# Patient Record
Sex: Male | Born: 2000 | Race: White | Hispanic: No | Marital: Single | Smoking: Never smoker
Health system: Southern US, Community
[De-identification: ages and names within clinical notes are randomized; demographics above are authoritative.]

---

## 2020-10-15 ENCOUNTER — Ambulatory Visit (HOSPITAL_COMMUNITY)
Admission: EM | Admit: 2020-10-15 | Discharge: 2020-10-15 | Disposition: A | Discharge status: Home / Self Care | Payer: 59 | Attending: Registered Nurse | Admitting: Registered Nurse

## 2020-10-15 ENCOUNTER — Encounter (HOSPITAL_COMMUNITY): Payer: Self-pay

## 2020-10-15 ENCOUNTER — Other Ambulatory Visit: Payer: Self-pay

## 2020-10-15 DIAGNOSIS — F4323 Adjustment disorder with mixed anxiety and depressed mood: Secondary | ICD-10-CM
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
## 2020-10-23 ENCOUNTER — Telehealth (HOSPITAL_COMMUNITY): Payer: Self-pay | Admitting: General Practice

## 2020-10-23 NOTE — Telephone Encounter (Signed)
Care Management - Follow Up [facility] Discharges   Writer attempted to make contact with patient today and was unsuccessful.  Writer was able to leave a HIPPA compliant voice message and will await callback.   

## 2020-10-27 ENCOUNTER — Encounter (HOSPITAL_COMMUNITY): Payer: Self-pay | Admitting: Emergency Medicine

## 2020-10-27 ENCOUNTER — Emergency Department (HOSPITAL_COMMUNITY)
Admission: EM | Admit: 2020-10-27 | Discharge: 2020-10-27 | Disposition: A | Discharge status: Home / Self Care | Payer: Managed Care, Other (non HMO) | Attending: Emergency Medicine | Admitting: Emergency Medicine

## 2020-10-27 ENCOUNTER — Other Ambulatory Visit: Payer: Self-pay

## 2020-10-27 ENCOUNTER — Emergency Department (HOSPITAL_COMMUNITY): Payer: Managed Care, Other (non HMO)

## 2020-10-27 DIAGNOSIS — R079 Chest pain, unspecified: Secondary | ICD-10-CM

## 2020-10-27 DIAGNOSIS — U071 COVID-19: Secondary | ICD-10-CM | POA: Insufficient documentation

## 2020-10-27 NOTE — ED Notes (Signed)
EDP at bedside  

## 2020-10-27 NOTE — ED Provider Notes (Signed)
[facility]-EMERGENCY DEPT Provider Note   CSN: [id number] Arrival date & time: 10/27/20  1759     History Chief Complaint  Patient presents with  . Covid Positive  . Chest Pain         Icarus Partch is a 19 y.o. male.  HPI     History reviewed. No pertinent past medical history. 19 year old male with nasal congestion, cough, positive Covid test on Tuesday at [facility] presents today complaining of chest pain.  He states that when he awoke this morning he began having some left-sided pain that worsens with movement.  He does not feel dyspneic.  He has had some cough.  He denies nausea, vomiting, or diarrhea.  He did complete 2 Covid vaccines but did not have a booster.  He works and goes to school at [employer].  He denies any past medical history.  He has been taking over-the-counter medications  Patient Active Problem List   Diagnosis Date Noted  . Adjustment disorder with mixed anxiety and depressed mood 10/15/2020    History reviewed. No pertinent surgical history.     History reviewed. No pertinent family history.  Social History   Tobacco Use  . Smoking status: Never Smoker  . Smokeless tobacco: Never Used  Vaping Use  . Vaping Use: Never used  Substance Use Topics  . Alcohol use: Yes    Comment: social     Home Medications Prior to Admission medications   Not on File    Allergies    Patient has no known allergies.  Review of Systems   Review of Systems  All other systems reviewed and are negative.   Physical Exam Updated Vital Signs BP 131/88 (BP Location: Left Arm)   Pulse 94   Temp 99.3 F (37.4 C) (Oral)   Resp (!) 25   SpO2 99%   Physical Exam Vitals and nursing note reviewed.  Constitutional:      Appearance: He is well-developed.  HENT:     Head: Normocephalic.  Eyes:     Pupils: Pupils are equal, round, and reactive to light.  Cardiovascular:     Rate and Rhythm: Normal rate and regular rhythm.     Heart sounds:  Normal heart sounds.  Pulmonary:     Effort: Pulmonary effort is normal.     Breath sounds: Normal breath sounds.  Abdominal:     General: Bowel sounds are normal.     Palpations: Abdomen is soft.  Musculoskeletal:        General: Normal range of motion.     Cervical back: Normal range of motion.  Skin:    General: Skin is warm and dry.     Capillary Refill: Capillary refill takes less than 2 seconds.  Neurological:     General: No focal deficit present.     Mental Status: He is alert.     ED Results / Procedures / Treatments   Labs (all labs ordered are listed, but only abnormal results are displayed) Labs Reviewed - No data to display  EKG EKG Interpretation  Date/Time:  Saturday October 27 2020 18:53:47 EST Ventricular Rate:  92 PR Interval:    QRS Duration: 85 QT Interval:  331 QTC Calculation: 410 R Axis:   80 Text Interpretation: Normal sinus rhythm Normal ECG Confirmed by Margarita Grizzle [phone]) on 10/27/2020 9:29:27 PM   Radiology DG Chest Port 1 View  Result Date: 10/27/2020 CLINICAL DATA:  Cough, COVID positive EXAM: PORTABLE CHEST 1 VIEW COMPARISON:  None. FINDINGS: The heart size and mediastinal contours are within normal limits. No focal consolidation. No pulmonary edema. No pleural effusion. No pneumothorax. No acute osseous abnormality. IMPRESSION: No active disease. Electronically Signed   By: Tish Frederickson M.D.   On: 10/27/2020 19:35    Procedures Procedures (including critical care time)  Medications Ordered in ED Medications - No data to display  ED Course  I have reviewed the triage vital signs and the nursing notes.  Pertinent labs & imaging results that were available during my care of the patient were reviewed by me and considered in my medical decision making (see chart for details).    MDM Rules/Calculators/A&P                          Patient with Covid and chest pain that appears to be musculoskeletal.  EKG and chest x-Keylan Costabile without  acute abnormalities.  Patient hemodynamically stable and appears stable for discharge. Final Clinical Impression(s) / ED Diagnoses Final diagnoses:  COVID  Chest pain, unspecified type    Rx / DC Orders ED Discharge Orders    None       Margarita Grizzle, MD 10/27/20 2130

## 2020-10-27 NOTE — ED Triage Notes (Signed)
Pt reports got tested at [facility] for [facility] due to congestion, cough, fatigue and results came back positive on Tuesday. Reports having central chest pains that started today.

## 2021-06-06 IMAGING — DX DG CHEST 1V PORT
1 series · 1 of 1 positions shown · non-contrast
Comparison: None.

CLINICAL DATA: Cough, COVID positive

EXAM:
PORTABLE CHEST 1 VIEW

[chest ap]
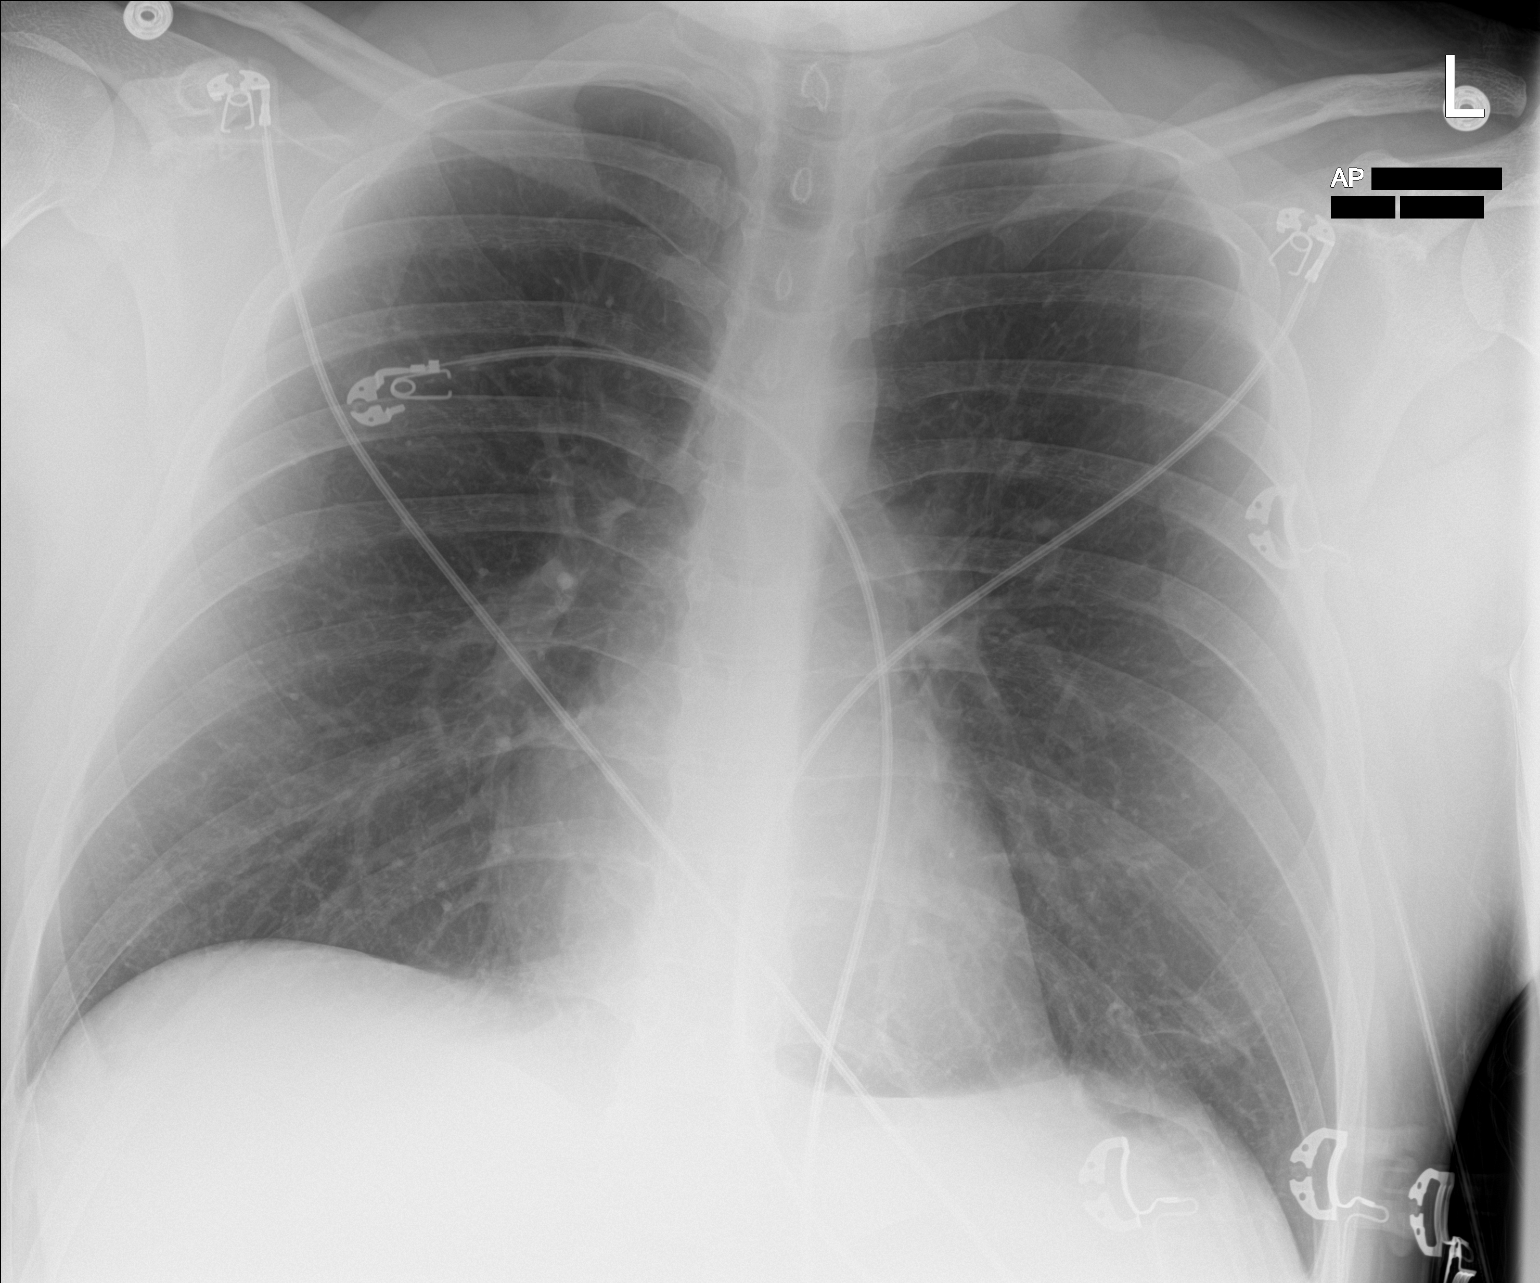

[1 of 1 positions shown; findings below may reference images not displayed]

FINDINGS: The heart size and mediastinal contours are within normal limits.

No focal consolidation. No pulmonary edema. No pleural effusion. No
pneumothorax.

No acute osseous abnormality.
IMPRESSION: No active disease.

## 2022-11-25 ENCOUNTER — Ambulatory Visit (HOSPITAL_COMMUNITY)
Admission: EM | Admit: 2022-11-25 | Discharge: 2022-11-25 | Disposition: A | Discharge status: Home / Self Care | Payer: Managed Care, Other (non HMO)

## 2022-11-25 ENCOUNTER — Encounter (HOSPITAL_COMMUNITY): Payer: Self-pay

## 2022-11-25 ENCOUNTER — Ambulatory Visit (HOSPITAL_COMMUNITY): Payer: Managed Care, Other (non HMO)

## 2022-11-25 DIAGNOSIS — M722 Plantar fascial fibromatosis: Secondary | ICD-10-CM | POA: Diagnosis not present

## 2022-11-25 NOTE — ED Provider Notes (Signed)
[facility]    CSN: [id number] Arrival date & time: 11/25/22  1130      History   Chief Complaint Chief Complaint  Patient presents with   Foot Pain    HPI Thomas Cabrera is a 22 y.o. adult.  Patient presents complaining of right foot pain that started 1 week ago.  Patient denies any trauma or fall upon onset of symptoms.  Patient denies any noticeable swelling or changes to the skin on his foot. Patient reports pain is worsened while walking, standing, and while he is working.  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  He reports that he wears steel toed shoes while he is working.  He denies any history of injury to his right foot.  He reports that he has been taking ibuprofen but is uncertain if he is noticing any improvement in his foot pain.     Foot Pain    History reviewed. No pertinent past medical history.  Patient Active Problem List   Diagnosis Date Noted   Adjustment disorder with mixed anxiety and depressed mood 10/15/2020    History reviewed. No pertinent surgical history.     Home Medications    Prior to Admission medications   Not on File    Family History Family History  Problem Relation Age of Onset   Diabetes Father     Social History Social History   Tobacco Use   Smoking status: Never   Smokeless tobacco: Never  Vaping Use   Vaping Use: Some days  Substance Use Topics   Alcohol use: Yes    Comment: social      Allergies   Patient has no known allergies.   Review of Systems Review of Systems Per HPI  Physical Exam Triage Vital Signs ED Triage Vitals  Enc Vitals Group     BP 11/25/22 1323 131/86     Pulse Rate 11/25/22 1323 79     Resp 11/25/22 1323 18     Temp 11/25/22 1323 97.9 F (36.6 C)     Temp Source 11/25/22 1323 Oral     SpO2 11/25/22 1323 96 %     Weight --      Height --      Head Circumference --      Peak Flow --       Pain Score 11/25/22 1324 8     Pain Loc --      Pain Edu? --      Excl. in [facility]? --    No data found.  Updated Vital Signs BP 131/86 (BP Location: Right Arm)   Pulse 79   Temp 97.9 F (36.6 C) (Oral)   Resp 18   SpO2 96%     Physical Exam Vitals and nursing note reviewed.  Cardiovascular:     Pulses:          Dorsalis pedis pulses are 2+ on the right side.  Musculoskeletal:     Right foot: Normal range of motion. No swelling, deformity, bunion or bony tenderness.       Feet:  Feet:     Right foot:     Skin integrity: No skin breakdown or erythema.     Toenail Condition: Right toenails are normal.     Comments: RT foot: Pain located along arch (medial) on plantar side of right foot. No erythema or changes to skin on plantar side. No swelling present.  Normal cap refill.              UC Treatments / Results  Labs (all labs ordered are listed, but only abnormal results are displayed) Labs Reviewed - No data to display  EKG   Radiology No results found.  Procedures Procedures (including critical care time)  Medications Ordered in UC Medications - No data to display  Initial Impression / Assessment and Plan / UC Course  I have reviewed the triage vital signs and the nursing notes.  Pertinent labs & imaging results that were available during my care of the patient were reviewed by me and considered in my medical decision making (see chart for details).     Patient was evaluated for plantar fasciitis of the right foot.  Patient was educated on plantar fasciitis and made aware of methods for management.  He was given information for podiatrist for follow-up if symptoms do not improve.  Patient verbalized understanding of instructions.  Charting was provided using a a verbal dictation system, charting was proofread for errors, errors may occur which could change the meaning of the information charted.   Final Clinical Impressions(s) / UC Diagnoses   Final  diagnoses:  Plantar fasciitis of right foot     Discharge Instructions      As discussed, changed to soles of your shoes that you use for work to determine if this will provide you some relief of symptoms.  It may be helpful to visit a shoe store that specializes in orthotics to determine the best fit for your foot.   I have attached information for rehab exercises to the back of your paperwork.  You may begin using a tennis ball to stretch out the affected area.   You can take ibuprofen every 6 hours (400-600 mg) , do not take more than 2400 mg in a 24-hour day.  I advised that you do not take ibuprofen on an empty stomach, ibuprofen can cause GI problems such as GI bleeding.   Podiatry information is attached your paperwork, if symptoms are not improving I suggest that you follow-up with this office.     ED Prescriptions   None    PDMP not reviewed this encounter.   Flossie Dibble, NP 11/25/22 1537

## 2022-11-25 NOTE — ED Triage Notes (Addendum)
Patient with c/o right foot pain around the arch. Patient states it usually doesn't start hurting until midday but today was hurting immediately upon wakening. Patient denies injury.

## 2022-11-25 NOTE — Discharge Instructions (Addendum)
As discussed, changed to soles of your shoes that you use for work to determine if this will provide you some relief of symptoms.  It may be helpful to visit a shoe store that specializes in orthotics to determine the best fit for your foot.   I have attached information for rehab exercises to the back of your paperwork.  You may begin using a tennis ball to stretch out the affected area.   You can take ibuprofen every 6 hours (400-600 mg) , do not take more than 2400 mg in a 24-hour day.  I advised that you do not take ibuprofen on an empty stomach, ibuprofen can cause GI problems such as GI bleeding.   Podiatry information is attached your paperwork, if symptoms are not improving I suggest that you follow-up with this office.
